# Patient Record
Sex: Male | Born: 2000 | Race: Black or African American | Hispanic: No | Marital: Single | Smoking: Never smoker
Health system: Southern US, Community
[De-identification: ages and names within clinical notes are randomized; demographics above are authoritative.]

## PROBLEM LIST (undated history)

## (undated) HISTORY — PX: EYE SURGERY: SHX253

---
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
## 2018-06-11 ENCOUNTER — Emergency Department (HOSPITAL_COMMUNITY)
Admission: EM | Admit: 2018-06-11 | Discharge: 2018-06-11 | Disposition: A | Discharge status: Home / Self Care | Payer: Medicaid Other | Attending: Emergency Medicine | Admitting: Emergency Medicine

## 2018-06-11 ENCOUNTER — Encounter (HOSPITAL_COMMUNITY): Payer: Self-pay | Admitting: Emergency Medicine

## 2018-06-11 ENCOUNTER — Other Ambulatory Visit: Payer: Self-pay

## 2018-06-11 DIAGNOSIS — K130 Diseases of lips: Secondary | ICD-10-CM | POA: Insufficient documentation

## 2018-06-11 DIAGNOSIS — Z5321 Procedure and treatment not carried out due to patient leaving prior to being seen by health care provider: Secondary | ICD-10-CM | POA: Insufficient documentation
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
## 2019-11-02 ENCOUNTER — Encounter (HOSPITAL_COMMUNITY): Payer: Self-pay | Admitting: Emergency Medicine

## 2019-11-02 ENCOUNTER — Other Ambulatory Visit: Payer: Self-pay

## 2019-11-02 ENCOUNTER — Emergency Department (HOSPITAL_COMMUNITY)
Admission: EM | Admit: 2019-11-02 | Discharge: 2019-11-02 | Disposition: A | Discharge status: Home / Self Care | Payer: Self-pay | Attending: Emergency Medicine | Admitting: Emergency Medicine

## 2019-11-02 DIAGNOSIS — Y92410 Unspecified street and highway as the place of occurrence of the external cause: Secondary | ICD-10-CM | POA: Insufficient documentation

## 2019-11-02 DIAGNOSIS — S0990XA Unspecified injury of head, initial encounter: Secondary | ICD-10-CM

## 2019-11-02 DIAGNOSIS — S0083XA Contusion of other part of head, initial encounter: Secondary | ICD-10-CM | POA: Diagnosis not present

## 2019-11-02 DIAGNOSIS — Y9389 Activity, other specified: Secondary | ICD-10-CM | POA: Diagnosis not present

## 2019-11-02 MED ORDER — NAPROXEN 250 MG PO TABS
500.0000 mg | ORAL_TABLET | Freq: Once | ORAL | Status: AC
  Administered 2019-11-02: 500 mg via ORAL
  Filled 2019-11-02 (×2): qty 2

## 2019-11-02 MED ORDER — NAPROXEN 500 MG PO TABS: 500.0000 mg | ORAL_TABLET | Freq: Two times a day (BID) | ORAL | 0 refills | Status: AC
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
## 2019-11-02 NOTE — Discharge Instructions (Addendum)
Please come back to the emergency department for worsening symptoms, otherwise apply an ice pack to the side of your face and take naproxen twice a day as needed for pain
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
## 2020-01-14 ENCOUNTER — Other Ambulatory Visit: Payer: Self-pay

## 2020-01-14 ENCOUNTER — Ambulatory Visit
Admission: EM | Admit: 2020-01-14 | Discharge: 2020-01-14 | Disposition: A | Discharge status: Home / Self Care | Payer: Self-pay | Attending: Emergency Medicine | Admitting: Emergency Medicine

## 2020-01-14 DIAGNOSIS — Z1152 Encounter for screening for COVID-19: Secondary | ICD-10-CM

## 2020-01-14 DIAGNOSIS — J069 Acute upper respiratory infection, unspecified: Secondary | ICD-10-CM

## 2020-01-14 MED ORDER — PREDNISONE 10 MG PO TABS: 20.0000 mg | ORAL_TABLET | Freq: Every day | ORAL | 0 refills | Status: AC

## 2020-01-14 MED ORDER — CETIRIZINE HCL 10 MG PO TABS: 10.0000 mg | ORAL_TABLET | Freq: Every day | ORAL | 0 refills | Status: AC

## 2020-01-14 MED ORDER — BENZONATATE 100 MG PO CAPS: 100.0000 mg | ORAL_CAPSULE | Freq: Three times a day (TID) | ORAL | 0 refills | Status: AC | PRN

## 2020-01-14 MED ORDER — FLUTICASONE PROPIONATE 50 MCG/ACT NA SUSP: 1.0000 | Freq: Every day | NASAL | 0 refills | Status: AC

## 2020-01-14 NOTE — ED Triage Notes (Signed)
Pt presents with c/o nasal congestion and headache that began today

## 2020-01-14 NOTE — Discharge Instructions (Addendum)
COVID testing ordered.  It will take between 2-7 days for test results.  Someone will contact you regarding abnormal results.    In the meantime: You should remain isolated in your home for 10 days from symptom onset AND greater than 24  hours after symptoms resolution (absence of fever without the use of fever-reducing medication and improvement in respiratory symptoms), whichever is longer Get plenty of rest and push fluids Tessalon Perles prescribed for cough Zyrtec for nasal congestion, runny nose, and/or sore throat Flonase for nasal congestion and runny nose Prednisone was prescribed Use medications daily for symptom relief Use OTC medications like ibuprofen or tylenol as needed fever or pain Call or go to the ED if you have any new or worsening symptoms such as fever, worsening cough, shortness of breath, chest tightness, chest pain, turning blue, changes in mental status, etc...  

## 2020-01-14 NOTE — ED Provider Notes (Signed)
[facility]   [id number] 01/14/20 Arrival Time: 1823   CC: COVID symptoms  SUBJECTIVE: History from: patient and family.  Eddie Cruz is a 19 y.o. male who presented to the urgent care for complaint of nasal congestion and headache that started today.  Denies sick exposure to COVID, flu or strep.  Denies recent travel.  Has tried OTC medication without relief.  Denies any aggravating factors.  Denies previous symptoms in the past.   Denies fever, chills, fatigue, sinus pain, rhinorrhea, sore throat, SOB, wheezing, chest pain, nausea, changes in bowel or bladder habits.     ROS: As per HPI.  All other pertinent ROS negative.      History reviewed. No pertinent past medical history. Past Surgical History:  Procedure Laterality Date   EYE SURGERY     No Known Allergies No current facility-administered medications on file prior to encounter.   Current Outpatient Medications on File Prior to Encounter  Medication Sig Dispense Refill   naproxen (NAPROSYN) 500 MG tablet Take 1 tablet (500 mg total) by mouth 2 (two) times daily. 30 tablet 0   Social History   Socioeconomic History   Marital status: Single    Spouse name: Not on file   Number of children: Not on file   Years of education: Not on file   Highest education level: Not on file  Occupational History   Not on file  Tobacco Use   Smoking status: Never Smoker   Smokeless tobacco: Never Used  Substance and Sexual Activity   Alcohol use: No   Drug use: Yes    Frequency: 1.0 times per week    Types: Marijuana    Comment: smokes every other week    Sexual activity: Never  Other Topics Concern   Not on file  Social History Narrative   Not on file   Social Determinants of Health   Financial Resource Strain: Not on file  Food Insecurity: Not on file  Transportation Needs: Not on file  Physical Activity: Not on file  Stress: Not on file  Social Connections: Not on file  Intimate  Partner Violence: Not on file   History reviewed. No pertinent family history.  OBJECTIVE:  Vitals:   01/14/20 1852  BP: 120/77  Pulse: 83  Resp: 18  Temp: 98.9 F (37.2 C)  SpO2: 99%     General appearance: alert; appears fatigued, but nontoxic; speaking in full sentences and tolerating own secretions HEENT: NCAT; Ears: EACs clear, TMs pearly gray; Eyes: PERRL.  EOM grossly intact. Sinuses: nontender; Nose: nares patent without rhinorrhea, Throat: oropharynx clear, tonsils non erythematous or enlarged, uvula midline  Neck: supple without LAD Lungs: unlabored respirations, symmetrical air entry; cough: moderate; no respiratory distress; CTAB Heart: regular rate and rhythm.  Radial pulses 2+ symmetrical bilaterally Skin: warm and dry Psychological: alert and cooperative; normal mood and affect  LABS:  No results found for this or any previous visit (from the past 24 hour(s)).   ASSESSMENT & PLAN:  1. Viral URI   2. Encounter for screening for COVID-19     Meds ordered this encounter  Medications   benzonatate (TESSALON) 100 MG capsule    Sig: Take 1 capsule (100 mg total) by mouth 3 (three) times daily as needed for cough.    Dispense:  30 capsule    Refill:  0   fluticasone (FLONASE) 50 MCG/ACT nasal spray    Sig: Place 1 spray into both nostrils daily for 14 days.  Dispense:  16 g    Refill:  0   cetirizine (ZYRTEC ALLERGY) 10 MG tablet    Sig: Take 1 tablet (10 mg total) by mouth daily.    Dispense:  30 tablet    Refill:  0   predniSONE (DELTASONE) 10 MG tablet    Sig: Take 2 tablets (20 mg total) by mouth daily.    Dispense:  15 tablet    Refill:  0    Discharge Instructions.Marland Kitchen    COVID testing ordered.  It will take between 2-7 days for test results.  Someone will contact you regarding abnormal results.    In the meantime: You should remain isolated in your home for 10 days from symptom onset AND greater than 24 hours after symptoms resolution  (absence of fever without the use of fever-reducing medication and improvement in respiratory symptoms), whichever is longer Get plenty of rest and push fluids Tessalon Perles prescribed for cough Zyrtec for nasal congestion, runny nose, and/or sore throat Flonase for nasal congestion and runny nose Prednisone was prescribed Use medications daily for symptom relief Use OTC medications like ibuprofen or tylenol as needed fever or pain Call or go to the ED if you have any new or worsening symptoms such as fever, worsening cough, shortness of breath, chest tightness, chest pain, turning blue, changes in mental status, etc...   Reviewed expectations re: course of current medical issues. Questions answered. Outlined signs and symptoms indicating need for more acute intervention. Patient verbalized understanding. After Visit Summary given.         Durward Parcel, FNP 01/14/20 1909

## 2020-01-15 LAB — SARS-COV-2, NAA 2 DAY TAT

## 2020-01-15 LAB — NOVEL CORONAVIRUS, NAA: SARS-CoV-2, NAA: DETECTED — AB

## 2020-01-16 ENCOUNTER — Telehealth: Payer: Self-pay | Admitting: Nurse Practitioner

## 2020-01-16 NOTE — Telephone Encounter (Signed)
Called to Discuss with patient about Covid symptoms and the use of the monoclonal antibody infusion for those with mild to moderate Covid symptoms and at a high risk of hospitalization.     Pt appears to qualify for this infusion due to co-morbid conditions and/or a member of an at-risk group in accordance with the FDA Emergency Use Authorization. (BMI, smoker/marijuana use)    Unable to reach pt, Voicemail left.   Willette Alma, NP [facility]  [phone]

## 2020-06-12 ENCOUNTER — Encounter: Payer: Self-pay | Admitting: Emergency Medicine

## 2020-06-12 ENCOUNTER — Other Ambulatory Visit: Payer: Self-pay

## 2020-06-12 ENCOUNTER — Ambulatory Visit
Admission: EM | Admit: 2020-06-12 | Discharge: 2020-06-12 | Disposition: A | Discharge status: Home / Self Care | Payer: Self-pay

## 2020-06-12 DIAGNOSIS — H9312 Tinnitus, left ear: Secondary | ICD-10-CM
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
